# Patient Record
Sex: Male | Born: 1965 | Race: White | Hispanic: No | Marital: Single | Smoking: Never smoker
Health system: Southern US, Community
[De-identification: ages and names within clinical notes are randomized; demographics above are authoritative.]

## PROBLEM LIST (undated history)

## (undated) DIAGNOSIS — E119 Type 2 diabetes mellitus without complications: Secondary | ICD-10-CM

---

## 2015-12-25 ENCOUNTER — Emergency Department (HOSPITAL_COMMUNITY): Payer: BLUE CROSS/BLUE SHIELD

## 2015-12-25 ENCOUNTER — Encounter (HOSPITAL_COMMUNITY): Payer: Self-pay | Admitting: Emergency Medicine

## 2015-12-25 ENCOUNTER — Emergency Department (HOSPITAL_COMMUNITY)
Admission: EM | Admit: 2015-12-25 | Discharge: 2015-12-26 | Disposition: A | Discharge status: Home / Self Care | Payer: BLUE CROSS/BLUE SHIELD | Attending: Emergency Medicine | Admitting: Emergency Medicine

## 2015-12-25 DIAGNOSIS — E119 Type 2 diabetes mellitus without complications: Secondary | ICD-10-CM | POA: Insufficient documentation

## 2015-12-25 DIAGNOSIS — R079 Chest pain, unspecified: Secondary | ICD-10-CM

## 2015-12-25 DIAGNOSIS — R1013 Epigastric pain: Secondary | ICD-10-CM | POA: Insufficient documentation

## 2015-12-25 HISTORY — DX: Type 2 diabetes mellitus without complications: E11.9

## 2015-12-25 LAB — CBC
HCT: 42.7 % (ref 39.0–52.0)
Hemoglobin: 14.6 g/dL (ref 13.0–17.0)
MCH: 29.7 pg (ref 26.0–34.0)
MCHC: 34.2 g/dL (ref 30.0–36.0)
MCV: 87 fL (ref 78.0–100.0)
PLATELETS: 229 10*3/uL (ref 150–400)
RBC: 4.91 MIL/uL (ref 4.22–5.81)
RDW: 12.7 % (ref 11.5–15.5)
WBC: 11.9 10*3/uL — ABNORMAL HIGH (ref 4.0–10.5)

## 2015-12-25 LAB — HEPATIC FUNCTION PANEL
ALT: 38 U/L (ref 17–63)
AST: 21 U/L (ref 15–41)
Albumin: 4.3 g/dL (ref 3.5–5.0)
Alkaline Phosphatase: 74 U/L (ref 38–126)
BILIRUBIN DIRECT: 0.1 mg/dL (ref 0.1–0.5)
BILIRUBIN TOTAL: 0.6 mg/dL (ref 0.3–1.2)
Indirect Bilirubin: 0.5 mg/dL (ref 0.3–0.9)
Total Protein: 6.9 g/dL (ref 6.5–8.1)

## 2015-12-25 LAB — D-DIMER, QUANTITATIVE (NOT AT ARMC): D DIMER QUANT: 0.36 ug{FEU}/mL (ref 0.00–0.50)

## 2015-12-25 LAB — BASIC METABOLIC PANEL
Anion gap: 12 (ref 5–15)
BUN: 11 mg/dL (ref 6–20)
CALCIUM: 9.2 mg/dL (ref 8.9–10.3)
CO2: 24 mmol/L (ref 22–32)
Chloride: 105 mmol/L (ref 101–111)
Creatinine, Ser: 1.1 mg/dL (ref 0.61–1.24)
GFR calc non Af Amer: >60 mL/min (ref >60)
GLUCOSE: 146 mg/dL — AB (ref 65–99)
Potassium: 3.7 mmol/L (ref 3.5–5.1)
Sodium: 141 mmol/L (ref 135–145)

## 2015-12-25 LAB — I-STAT TROPONIN, ED: TROPONIN I, POC: 0 ng/mL (ref 0.00–0.08)

## 2015-12-25 LAB — LIPASE, BLOOD: LIPASE: 22 U/L (ref 11–51)

## 2015-12-25 MED ORDER — GI COCKTAIL ~~LOC~~
ORAL | Status: AC
  Filled 2015-12-25: qty 30

## 2015-12-25 MED ORDER — GI COCKTAIL ~~LOC~~
30.0000 mL | Freq: Once | ORAL | Status: AC
  Administered 2015-12-25: 30 mL via ORAL

## 2015-12-25 NOTE — ED Triage Notes (Signed)
Pt c/o epigastric pain and L shoulder pain x3 days, no cardiac hx but family hx of stents. Pt in NAD. Described it as discomfort. Pt also endoreses weight gain in couple of months. VSS.

## 2015-12-25 NOTE — ED Provider Notes (Signed)
[facility]-EMERGENCY DEPT Provider Note   CSN: [id number] Arrival date & time: 12/25/15  1948 By signing my name below, I, Bridgette HabermannMaria Tan, attest that this documentation has been prepared under the direction and in the presence of Glynn OctaveStephen Ramatoulaye Pack, MD. Electronically Signed: Bridgette HabermannMaria Tan, ED Scribe. 12/25/15. 11:10 PM.  History   Chief Complaint Chief Complaint  Patient presents with  . Chest Pain  . Dizziness   HPI Comments: Jose Bird is a 50 y.o. male with h/o DM and hyperlipidemia who presents to the Emergency Department complaining of intermittent epigastric pain and left shoulder pain onset 3 days ago. Per pt, the pain has been more persistent today compared to the past couple days. Each episode lasts several hours. Pt describes his current pain as a "discomfort". Pt reports that he also has associated light-headedness and dizziness. Pain is improved upon belching, no other alleviating factors noted. Pt was on Metformin for his type 2 diabetes but states he is no longer on it at this time. He notes that he has gained weight in the past couple of months. Pt denies h/o MI but notes family h/o stents. His 49 year old brother recently had an MI. Denies h/o GERD or stomach ulcers. Pt further denies speech difficulty, back pain, shortness of breath, fever, chills, diaphoresis, or any other associated symptoms.   The history is provided by the patient. No language interpreter was used.   Past Medical History:  Diagnosis Date  . Diabetes mellitus without complication (HCC)    There are no active problems to display for this patient.  History reviewed. No pertinent surgical history.   Home Medications    Prior to Admission medications   Not on File    Family History No family history on file.  Social History Social History  Substance Use Topics  . Smoking status: Never Smoker  . Smokeless tobacco: Not on file  . Alcohol use Yes     Allergies   Patient has no known  allergies.   Review of Systems Review of Systems  10 Systems reviewed and all are negative for acute change except as noted in the HPI. Physical Exam Updated Vital Signs BP 132/88   Pulse 69   Temp 98.3 F (36.8 C) (Oral)   Resp 19   SpO2 97%   Physical Exam  Constitutional: He is oriented to person, place, and time. He appears well-developed and well-nourished. No distress.  Pt appears uncomfortable.  HENT:  Head: Normocephalic and atraumatic.  Mouth/Throat: Oropharynx is clear and moist. No oropharyngeal exudate.  Eyes: Conjunctivae and EOM are normal. Pupils are equal, round, and reactive to light.  Neck: Normal range of motion. Neck supple.  No meningismus.  Cardiovascular: Normal rate, regular rhythm, normal heart sounds and intact distal pulses.  Exam reveals no gallop and no friction rub.   No murmur heard. Pulmonary/Chest: Effort normal and breath sounds normal. No respiratory distress. He has no wheezes. He has no rales.  Abdominal: Soft. Bowel sounds are normal. There is tenderness. There is no rebound and no guarding.  Mild epigastric tenderness. No RUQ tenderness.  Musculoskeletal: Normal range of motion. He exhibits no edema or tenderness.  Neurological: He is alert and oriented to person, place, and time. No cranial nerve deficit. He exhibits normal muscle tone. Coordination normal.   5/5 strength throughout. CN 2-12 intact.Equal grip strength.   Skin: Skin is warm.  Psychiatric: He has a normal mood and affect. His behavior is normal.  Nursing note and vitals  reviewed.  ED Treatments / Results  DIAGNOSTIC STUDIES: Oxygen Saturation is 97% on RA, adequate by my interpretation.    COORDINATION OF CARE: 11:08 PM Discussed treatment plan with pt at bedside which includes repeat EKG and pt agreed to plan.  Labs (all labs ordered are listed, but only abnormal results are displayed) Labs Reviewed  BASIC METABOLIC PANEL - Abnormal; Notable for the following:        Result Value   Glucose, Bld 146 (*)    All other components within normal limits  CBC - Abnormal; Notable for the following:    WBC 11.9 (*)    All other components within normal limits  HEPATIC FUNCTION PANEL  LIPASE, BLOOD  D-DIMER, QUANTITATIVE (NOT AT [facility])  TROPONIN I  I-STAT TROPOININ, ED    EKG  EKG Interpretation  Date/Time:  Friday December 25 2015 19:56:06 EST Ventricular Rate:  92 PR Interval:  140 QRS Duration: 88 QT Interval:  366 QTC Calculation: 452 R Axis:   24 Text Interpretation:  Normal sinus rhythm Normal ECG No previous ECGs available Confirmed by Manus GunningANCOUR  MD, Adelise Buswell [phone]([id number]) on 12/25/2015 10:45:53 PM       Radiology Dg Chest 2 View  Result Date: 12/25/2015 CLINICAL DATA:  Chest pain radiating to left shoulder for 2 weeks. Lightheadedness. EXAM: CHEST  2 VIEW COMPARISON:  None. FINDINGS: The heart size and mediastinal contours are within normal limits. Both lungs are clear. The visualized skeletal structures are unremarkable. IMPRESSION: No active cardiopulmonary disease. Electronically Signed   By: Myles RosenthalJohn  Stahl M.D.   On: 12/25/2015 20:31    Procedures Procedures (including critical care time)  Medications Ordered in ED Medications - No data to display   Initial Impression / Assessment and Plan / ED Course  I have reviewed the triage vital signs and the nursing notes.  Pertinent labs & imaging results that were available during my care of the patient were reviewed by me and considered in my medical decision making (see chart for details).  Clinical Course    Patient with 3 days of intermittent epigastric pain rating to left shoulder lasting several hours at a time. Not exertional or pleuritic  EKG has no acute ST changes.  Troponin negative. LFTs and lipase normal. D-dimer negative. Heart score 3.  No episodes of chest pain while in the ED. Troponin negative 2. D-dimer negative.  2:41 AM Discussed lab work with patient which was  unremarkable, explained stress test is necessary but not at the ED and to follow up with a cardiologist. Pt does not have any chest pain / abdominal pain at this time. Discussed possible medication of Prilosec.  Advised to follow-up with PCP for stress test. Return precautions discussed. Observation admission offered and declined by patient. Start PPI. Return precautions discussed.  Final Clinical Impressions(s) / ED Diagnoses   Final diagnoses:  Chest pain, unspecified type   New Prescriptions New Prescriptions   No medications on file   I personally performed the services described in this documentation, which was scribed in my presence. The recorded information has been reviewed and is accurate.   Glynn OctaveStephen Ryelan Kazee, MD 12/26/15 [phone]

## 2015-12-26 LAB — TROPONIN I: Troponin I: <0.03 ng/mL (ref <0.03)

## 2015-12-26 MED ORDER — OMEPRAZOLE 20 MG PO CPDR
20.0000 mg | DELAYED_RELEASE_CAPSULE | Freq: Every day | ORAL | 0 refills | Status: AC
Start: 2015-12-26 — End: ?

## 2015-12-26 NOTE — Discharge Instructions (Signed)
As we discussed you should follow up with her doctor for a stress test. Take the stomach medication as prescribed. Return to the ED if you develop worsening chest pain, shortness of breath, vomiting, sweating or any other concerns.

## 2017-08-03 ENCOUNTER — Emergency Department (HOSPITAL_COMMUNITY)
Admission: EM | Admit: 2017-08-03 | Discharge: 2017-08-03 | Disposition: A | Discharge status: Home / Self Care | Payer: BLUE CROSS/BLUE SHIELD | Attending: Emergency Medicine | Admitting: Emergency Medicine

## 2017-08-03 ENCOUNTER — Emergency Department (HOSPITAL_COMMUNITY): Payer: BLUE CROSS/BLUE SHIELD

## 2017-08-03 ENCOUNTER — Other Ambulatory Visit: Payer: Self-pay

## 2017-08-03 ENCOUNTER — Encounter (HOSPITAL_COMMUNITY): Payer: Self-pay | Admitting: *Deleted

## 2017-08-03 DIAGNOSIS — M25561 Pain in right knee: Secondary | ICD-10-CM

## 2017-08-03 DIAGNOSIS — E119 Type 2 diabetes mellitus without complications: Secondary | ICD-10-CM | POA: Insufficient documentation

## 2017-08-03 DIAGNOSIS — S82434A Nondisplaced oblique fracture of shaft of right fibula, initial encounter for closed fracture: Secondary | ICD-10-CM | POA: Diagnosis not present

## 2017-08-03 DIAGNOSIS — Y999 Unspecified external cause status: Secondary | ICD-10-CM | POA: Insufficient documentation

## 2017-08-03 DIAGNOSIS — Y929 Unspecified place or not applicable: Secondary | ICD-10-CM | POA: Insufficient documentation

## 2017-08-03 DIAGNOSIS — Y9301 Activity, walking, marching and hiking: Secondary | ICD-10-CM | POA: Diagnosis not present

## 2017-08-03 DIAGNOSIS — Z79899 Other long term (current) drug therapy: Secondary | ICD-10-CM | POA: Diagnosis not present

## 2017-08-03 DIAGNOSIS — S8991XA Unspecified injury of right lower leg, initial encounter: Secondary | ICD-10-CM | POA: Diagnosis present

## 2017-08-03 DIAGNOSIS — S82831A Other fracture of upper and lower end of right fibula, initial encounter for closed fracture: Secondary | ICD-10-CM

## 2017-08-03 DIAGNOSIS — W010XXA Fall on same level from slipping, tripping and stumbling without subsequent striking against object, initial encounter: Secondary | ICD-10-CM | POA: Insufficient documentation

## 2017-08-03 MED ORDER — HYDROCODONE-ACETAMINOPHEN 5-325 MG PO TABS: 1.0000 | ORAL_TABLET | Freq: Four times a day (QID) | ORAL | 0 refills | Status: AC | PRN

## 2017-08-03 MED ORDER — IBUPROFEN 600 MG PO TABS: 600.0000 mg | ORAL_TABLET | Freq: Four times a day (QID) | ORAL | 0 refills | Status: AC | PRN

## 2017-08-03 MED ORDER — HYDROCODONE-ACETAMINOPHEN 5-325 MG PO TABS
2.0000 | ORAL_TABLET | Freq: Once | ORAL | Status: AC
  Administered 2017-08-03: 2 via ORAL
  Filled 2017-08-03: qty 2

## 2017-08-03 NOTE — ED Provider Notes (Signed)
[facility] EMERGENCY DEPARTMENT Provider Note   CSN: [id number] Arrival date & time: 08/03/17  0706     History   Chief Complaint Chief Complaint  Patient presents with  . Knee Pain    HPI Jose Bird is a 52 y.o. male with history of diabetes who presents with right knee pain after slip and fall.  Patient reports his right leg was on the carpet that was slippery and his knee went weird direction.  He fell, but not his head or lose consciousness.  He denies any other injuries besides pain in his knee.  He has baseline severe arthritis in his knee.  He has had some pain since the fall radiating down his right calf.  He denies any numbness or tingling.  He did not take any medication prior to arrival.  He has an orthopedic doctor at home in [facility].  HPI  Past Medical History:  Diagnosis Date  . Diabetes mellitus without complication (HCC)     There are no active problems to display for this patient.   History reviewed. No pertinent surgical history.      Home Medications    Prior to Admission medications   Medication Sig Start Date End Date Taking? Authorizing Provider  atorvastatin (LIPITOR) 20 MG tablet Take 20 mg by mouth every evening. 12/11/15   [provider]  HYDROcodone-acetaminophen (NORCO/VICODIN) 5-325 MG tablet Take 1-2 tablets by mouth every 6 (six) hours as needed. 08/03/17   Lisl Slingerland, Waylan Boga, PA-C  ibuprofen (ADVIL,MOTRIN) 600 MG tablet Take 1 tablet (600 mg total) by mouth every 6 (six) hours as needed. 08/03/17   Maleah Rabago, Waylan Boga, PA-C  omeprazole (PRILOSEC) 20 MG capsule Take 1 capsule (20 mg total) by mouth daily. 12/26/15   Glynn Octave, MD    Family History No family history on file.  Social History Social History   Tobacco Use  . Smoking status: Never Smoker  . Smokeless tobacco: Never Used  Substance Use Topics  . Alcohol use: Yes  . Drug use: Never     Allergies   Patient has no known  allergies.   Review of Systems Review of Systems  Musculoskeletal: Positive for arthralgias (R knee).  Neurological: Negative for syncope and numbness.     Physical Exam Updated Vital Signs BP (!) 143/95 (BP Location: Left Arm)   Pulse 82   Temp 98.9 F (37.2 C) (Oral)   Resp 16   Ht 6\' 3"  (1.905 m)   Wt 128.8 kg (284 lb)   SpO2 98%   BMI 35.50 kg/m   Physical Exam  Constitutional: He appears well-developed and well-nourished. No distress.  HENT:  Head: Normocephalic and atraumatic.  Eyes: Conjunctivae are normal. Right eye exhibits no discharge. Left eye exhibits no discharge. No scleral icterus.  Neck: Normal range of motion.  Cardiovascular: Normal rate, regular rhythm, normal heart sounds and intact distal pulses. Exam reveals no gallop and no friction rub.  No murmur heard. Pulmonary/Chest: Effort normal and breath sounds normal. No stridor. No respiratory distress. He has no wheezes. He has no rales.  Musculoskeletal: He exhibits no edema.       Legs: R knee: Tenderness to the medial anterior aspect and inferior lateral aspect; no significant edema, warmth, or erythema; negative anterior/posterior drawer; pain with full range of motion; some pain with varus and valgus stress, medial joint line tenderness; 5/5 strength to bilateral lower extremities  Neurological: He is alert. Coordination normal.  Skin: Skin is warm  and dry. No rash noted. He is not diaphoretic. No pallor.  Psychiatric: He has a normal mood and affect.  Nursing note and vitals reviewed.    ED Treatments / Results  Labs (all labs ordered are listed, but only abnormal results are displayed) Labs Reviewed - No data to display  EKG None  Radiology Dg Knee Complete 4 Views Right  Result Date: 08/03/2017 CLINICAL DATA:  52 year old male status post slip and fall at home with subsequent right knee pain. Arthritis. EXAM: RIGHT KNEE - COMPLETE 4+ VIEW COMPARISON:  None. FINDINGS: No definite joint  effusion. 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 Severe lateral compartment joint space loss. Moderate medial and lateral compartment degenerative spurring. Oblique largely nondisplaced fracture of the right fibula head best demonstrated on images 1 and 3. No other acute fracture or dislocation identified. There is possibly a healed chronic fracture of the proximal right fibula shaft with coarsening of the trabecular there. IMPRESSION: 1. Oblique nondisplaced fracture of the right fibula head. 2. No other acute osseous abnormality identified. 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 3. Superimposed moderate to severe degenerative changes at the right knee, worst in the lateral compartment. Electronically Signed   By: Odessa FlemingH  Hall M.D.   On: 08/03/2017 08:15    Procedures Procedures (including critical care time)  Medications Ordered in ED Medications  HYDROcodone-acetaminophen (NORCO/VICODIN) 5-325 MG per tablet 2 tablet (2 tablets Oral Given 08/03/17 0859)     Initial Impression / Assessment and Plan / ED Course  I have reviewed the triage vital signs and the nursing notes.  Pertinent labs & imaging results that were available during my care of the patient were reviewed by me and considered in my medical decision making (see chart for details).     Patient with oblique nondisplaced fibular head fracture on the right as well as moderate to severe degenerative changes.  Suspect possibility of soft tissue injury as well.  Patient will be placed in cam walking boot with crutches with partial weightbearing.  Patient will also be given a knee sleeve for support.  Will discharge home with short course of pain medication.  Supportive treatment discussed including ice and elevation.  Follow-up to orthopedics on return home to [place].  I discussed patient case with Earney HamburgMichael Jeffrey, orthopedic PA-C, who advised the above.  Patient understands and agrees  with plan.  Patient vitals stable throughout ED course and discharged in satisfactory condition.  Reviewed the [place] narcotic database and found no discrepancies.  Final Clinical Impressions(s) / ED Diagnoses   Final diagnoses:  Other closed fracture of proximal end of right fibula, initial encounter  Acute pain of right knee    ED Discharge Orders        Ordered    ibuprofen (ADVIL,MOTRIN) 600 MG tablet  Every 6 hours PRN     08/03/17 0905    HYDROcodone-acetaminophen (NORCO/VICODIN) 5-325 MG tablet  Every 6 hours PRN     08/03/17 0905       Emi HolesLaw, Orange Hilligoss M, PA-C 08/03/17 [id number]    Arby BarrettePfeiffer, Marcy, MD 08/06/17 [phone]

## 2017-08-03 NOTE — ED Notes (Signed)
Ortho with patient at this time.

## 2017-08-03 NOTE — ED Triage Notes (Signed)
Pt reports slipping on a rug today at home and fell with leg landing in an unnatural position, pt c/o R knee pain, pt hx of R knee sx 1988, pt does not take blood thinners, ambulatory with pain, A&O x4, denies hitting head or LOC

## 2017-08-03 NOTE — Progress Notes (Signed)
Orthopedic Tech Progress Note Patient Details:  Jose DollyChristopher Bird 04/07/1965 [id number]  Ortho Devices Type of Ortho Device: CAM walker, Knee Sleeve, Crutches Ortho Device/Splint Location: rle Ortho Device/Splint Interventions: Application   Post Interventions Instructions Provided: Care of device   Nikki DomCrawford, Keerthana Vanrossum 08/03/2017, 9:36 AM

## 2017-08-03 NOTE — Discharge Instructions (Signed)
Take ibuprofen every 6 hours as needed for your pain.  For breakthrough pain, take 1-2 Norco every 6 hours.  Use ice 3-4 times daily alternating 20 minutes on, 20 minutes off.  Use walking boot whenever bearing weight and use crutches as needed for partial weightbearing.  Please follow-up with your orthopedic doctor upon returning home.  Please return the emergency department if you develop any new or worsening symptoms.  Do not drink alcohol, drive, operate machinery or participate in any other potentially dangerous activities while taking opiate pain medication as it may make you sleepy. Do not take this medication with any other sedating medications, either prescription or over-the-counter. If you were prescribed Percocet or Vicodin, do not take these with acetaminophen (Tylenol) as it is already contained within these medications and overdose of Tylenol is dangerous.   This medication is an opiate (or narcotic) pain medication and can be habit forming.  Use it as little as possible to achieve adequate pain control.  Do not use or use it with extreme caution if you have a history of opiate abuse or dependence. This medication is intended for your use only - do not give any to anyone else and keep it in a secure place where nobody else, especially children, have access to it. It will also cause or worsen constipation, so you may want to consider taking an over-the-counter stool softener while you are taking this medication.

## 2018-11-30 IMAGING — DX DG KNEE COMPLETE 4+V*R*
4 series · 4 of 4 positions shown · non-contrast
Comparison: None.

CLINICAL DATA: 51-year-old male status post slip and fall at home
with subsequent right knee pain. Arthritis.

EXAM:
RIGHT KNEE - COMPLETE 4+ VIEW

[knee ap]
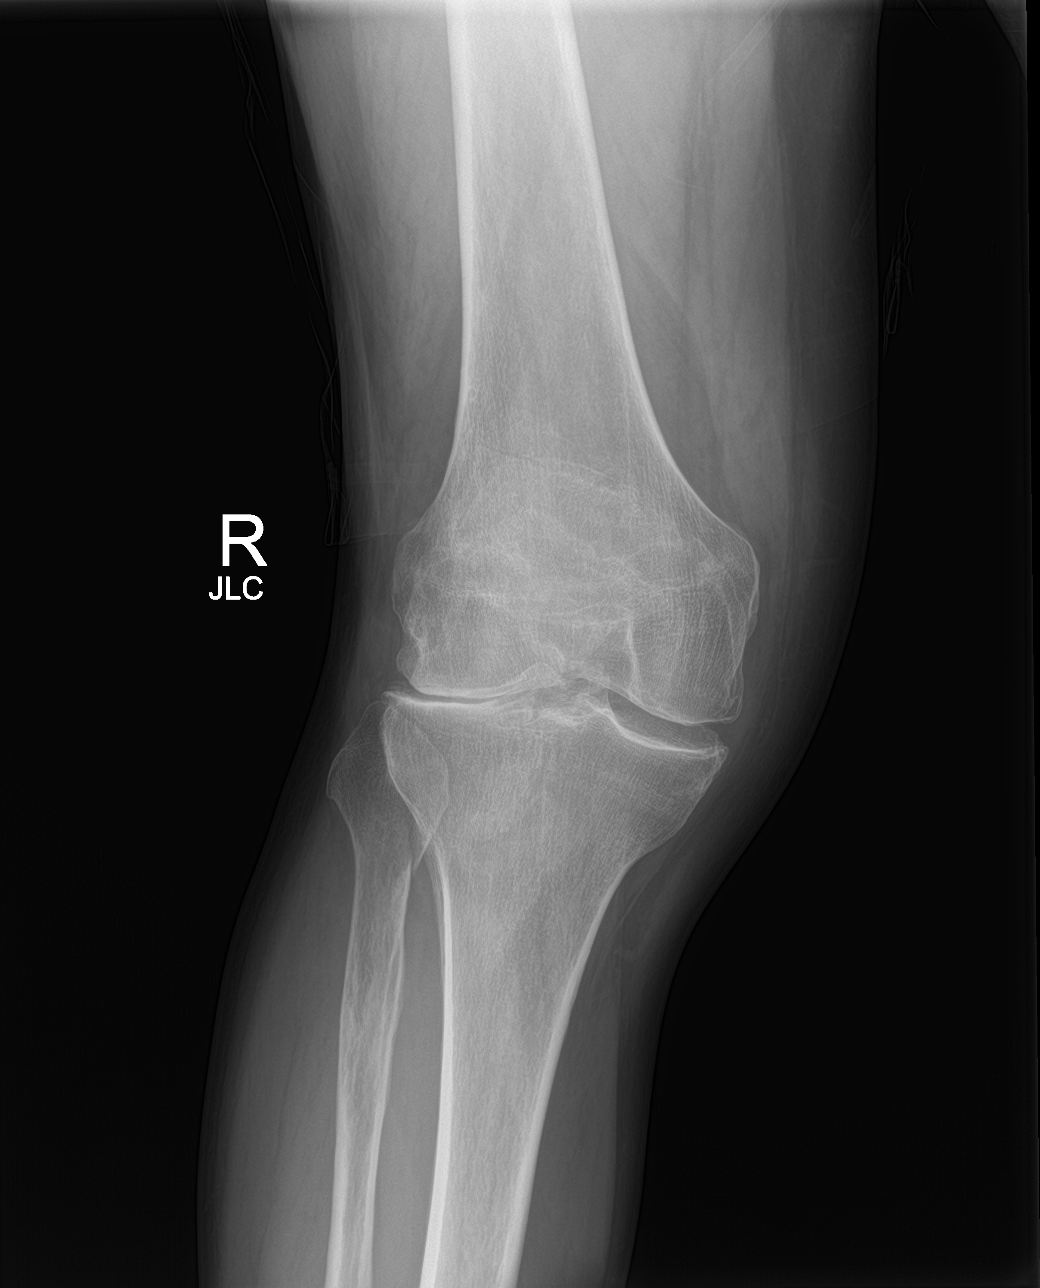

[knee obl (1 of 2)]
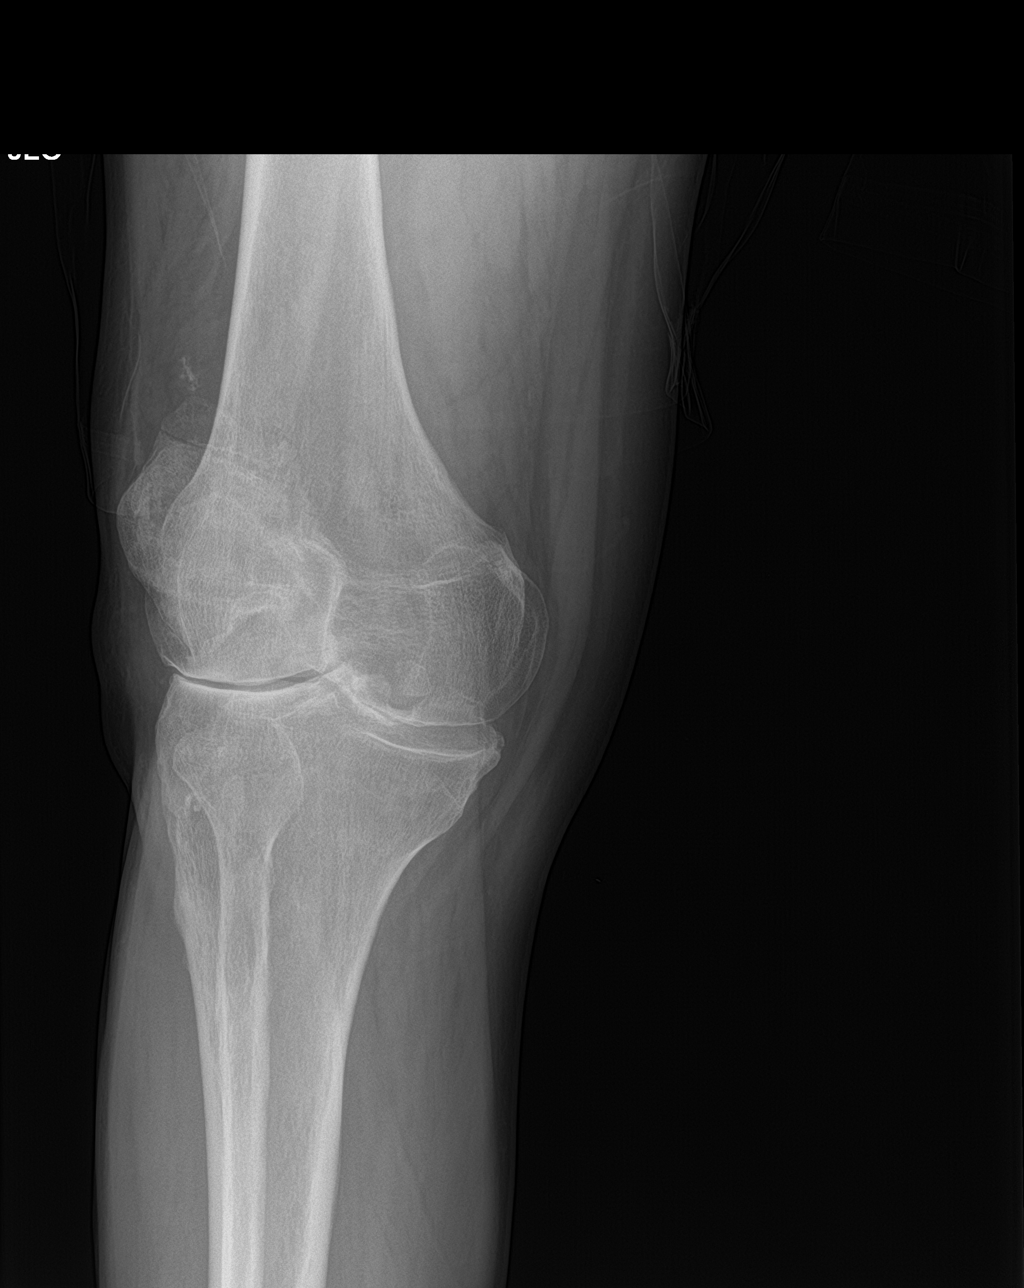

[knee obl (2 of 2)]
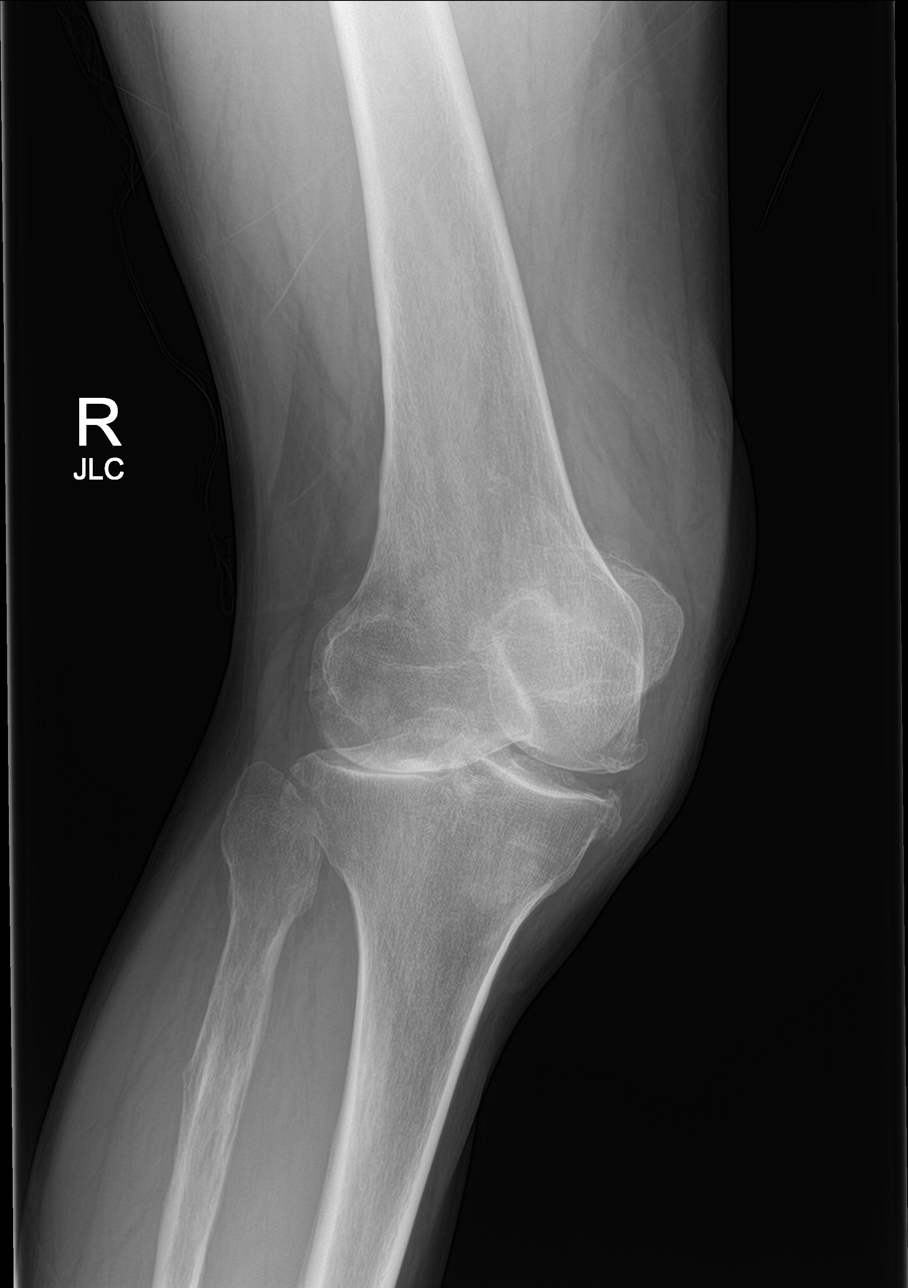

[knee lat]
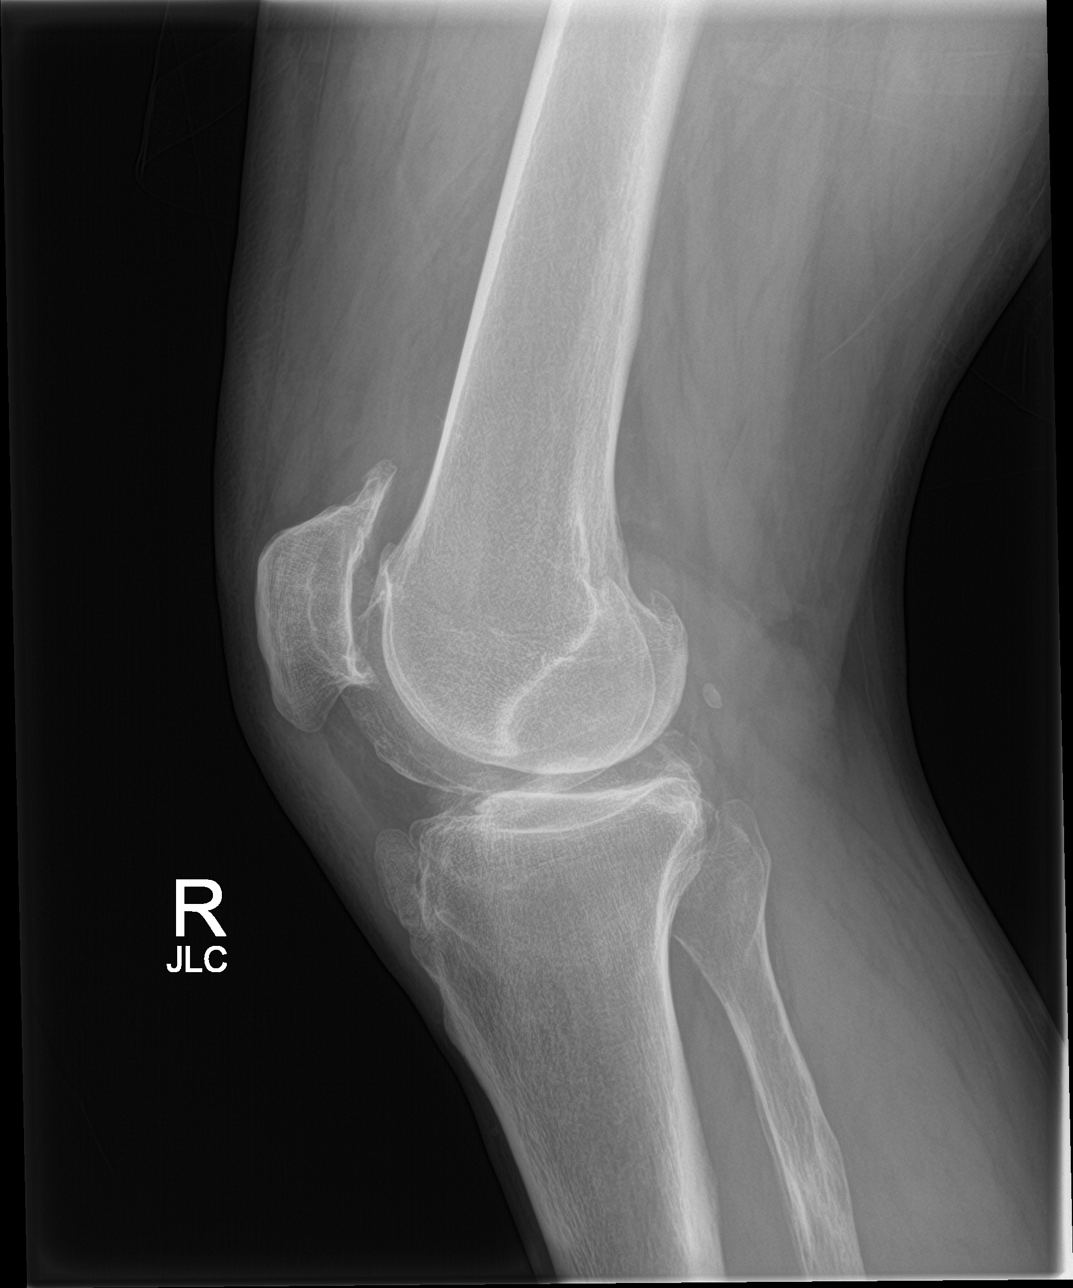

[4 of 4 positions shown; findings below may reference images not displayed]

FINDINGS: No definite joint effusion. Bipartite patella suspected with
superimposed moderate to severe patellofemoral degenerative
spurring. Severe lateral compartment joint space loss. Moderate
medial and lateral compartment degenerative spurring. Oblique
largely nondisplaced fracture of the right fibula head best
demonstrated on images 1 and 3. No other acute fracture or
dislocation identified. There is possibly a healed chronic fracture
of the proximal right fibula shaft with coarsening of the trabecular
there.
IMPRESSION: 1. Oblique nondisplaced fracture of the right fibula head.
2. No other acute osseous abnormality identified. Bipartite patella
suspected.
3. Superimposed moderate to severe degenerative changes at the right
knee, worst in the lateral compartment.
